# Patient Record
Sex: Female | Born: 1995 | Race: Black or African American | Hispanic: No | Marital: Single | Smoking: Never smoker
Health system: Southern US, Community
[De-identification: ages and names within clinical notes are randomized; demographics above are authoritative.]

## PROBLEM LIST (undated history)

## (undated) HISTORY — PX: BREAST SURGERY: SHX581

## (undated) HISTORY — PX: DG SCOLIOSIS SERIES (ARMC HX): HXRAD1605

## (undated) HISTORY — PX: BACK SURGERY: SHX140

---

## 2017-07-18 ENCOUNTER — Ambulatory Visit (HOSPITAL_COMMUNITY)
Admission: EM | Admit: 2017-07-18 | Discharge: 2017-07-18 | Disposition: A | Discharge status: Home / Self Care | Payer: No Typology Code available for payment source | Attending: Family Medicine | Admitting: Family Medicine

## 2017-07-18 ENCOUNTER — Encounter (HOSPITAL_COMMUNITY): Payer: Self-pay | Admitting: Family Medicine

## 2017-07-18 DIAGNOSIS — W2203XA Walked into furniture, initial encounter: Secondary | ICD-10-CM | POA: Diagnosis not present

## 2017-07-18 DIAGNOSIS — M79675 Pain in left toe(s): Secondary | ICD-10-CM

## 2017-07-18 DIAGNOSIS — S99922A Unspecified injury of left foot, initial encounter: Secondary | ICD-10-CM

## 2017-07-18 MED ORDER — NAPROXEN 500 MG PO TABS: 500.0000 mg | ORAL_TABLET | Freq: Two times a day (BID) | ORAL | 0 refills | Status: AC

## 2017-07-18 NOTE — ED Provider Notes (Signed)
[facility]    CSN: [id number] Arrival date & time: 07/18/17  1315     History   Chief Complaint Chief Complaint  Patient presents with  . Toe Injury    HPI Julia Gaines is a 21 y.o. female.   21 year old female comes in with left fifth toe pain after jamming toe on wood furniture last night. She used ice compress and a dose of NSAIDs with good relief. However pain increased after having to walk to class. Denies numbness, tingling, open wound.      History reviewed. No pertinent past medical history.  There are no active problems to display for this patient.   History reviewed. No pertinent surgical history.  OB History    No data available       Home Medications    Prior to Admission medications   Medication Sig Start Date End Date Taking? Authorizing Provider  naproxen (NAPROSYN) 500 MG tablet Take 1 tablet (500 mg total) by mouth 2 (two) times daily. 07/18/17 07/28/17  Belinda Fisher, PA-C    Family History History reviewed. No pertinent family history.  Social History Social History  Substance Use Topics  . Smoking status: Not on file  . Smokeless tobacco: Not on file  . Alcohol use Not on file     Allergies   Patient has no known allergies.   Review of Systems Review of Systems  Musculoskeletal: Positive for arthralgias and joint swelling. Negative for gait problem and myalgias.  Skin: Negative for wound.     Physical Exam Triage Vital Signs ED Triage Vitals [07/18/17 1334]  Enc Vitals Group     BP 118/67     Pulse Rate 85     Resp 18     Temp 98.2 F (36.8 C)     Temp src      SpO2 100 %     Weight      Height      Head Circumference      Peak Flow      Pain Score 5     Pain Loc      Pain Edu?      Excl. in GC?    No data found.   Updated Vital Signs BP 118/67   Pulse 85   Temp 98.2 F (36.8 C)   Resp 18   LMP 07/04/2017   SpO2 100%   Visual Acuity Right Eye Distance:   Left Eye Distance:   Bilateral  Distance:    Right Eye Near:   Left Eye Near:    Bilateral Near:     Physical Exam  Constitutional: She is oriented to person, place, and time. She appears well-developed and well-nourished. No distress.  HENT:  Head: Normocephalic and atraumatic.  Musculoskeletal:  Swelling of the left fifth toe. Tenderness on palpation of the left fifth toe. No tenderness on palpation of the fifth metatarsal. No obvious deformity seen. Full range of motion of the toes, though with pain. Pedal pulses 2+ and equal bilaterally. Sensation intact and equal bilaterally.  Neurological: She is alert and oriented to person, place, and time.  Skin: Skin is warm and dry.     UC Treatments / Results  Labs (all labs ordered are listed, but only abnormal results are displayed) Labs Reviewed - No data to display  EKG  EKG Interpretation None       Radiology No results found.  Procedures Procedures (including critical care time)  Medications Ordered in UC Medications -  No data to display   Initial Impression / Assessment and Plan / UC Course  I have reviewed the triage vital signs and the nursing notes.  Pertinent labs & imaging results that were available during my care of the patient were reviewed by me and considered in my medical decision making (see chart for details).    Discussed with patient given full ROM of toe, without deformity seen, xray results most likely will not change treatment, patient agrees to defer xray for now. Naproxen as directed. Ice compress and elevation. Patient having trouble fitting into her shoes due to the pain, postop boot given in office. Discussed with patient this can take up to 2-3 weeks to completely resolve, but she should be feeling better each week. Return precautions given.  Final Clinical Impressions(s) / UC Diagnoses   Final diagnoses:  Injury of toe on left foot, initial encounter    New Prescriptions New Prescriptions   NAPROXEN (NAPROSYN) 500 MG  TABLET    Take 1 tablet (500 mg total) by mouth 2 (two) times daily.     Belinda Fisher, PA-C 07/18/17 1401    Linward Headland V, PA-C 07/18/17 1401

## 2017-07-18 NOTE — ED Triage Notes (Signed)
Pt here for left 5th toe injury. sts that she stumped it last night and the pain has decreased today. Was able to walk to class.

## 2017-07-18 NOTE — Discharge Instructions (Signed)
Naproxen start naproxen as directed. Continue ice compress and elevation. This can take up to 2-3 weeks to completely resolve, but should be feeling better each week. If symptoms worsens, does not resolve, numbness, tingling, increased swelling, follow-up for reevaluation.

## 2017-08-01 ENCOUNTER — Encounter (HOSPITAL_COMMUNITY): Payer: Self-pay | Admitting: Emergency Medicine

## 2017-08-01 ENCOUNTER — Ambulatory Visit (HOSPITAL_COMMUNITY)
Admission: EM | Admit: 2017-08-01 | Discharge: 2017-08-01 | Disposition: A | Discharge status: Home / Self Care | Payer: No Typology Code available for payment source

## 2017-08-01 DIAGNOSIS — M79675 Pain in left toe(s): Secondary | ICD-10-CM

## 2017-08-01 DIAGNOSIS — T148XXA Other injury of unspecified body region, initial encounter: Secondary | ICD-10-CM

## 2017-08-01 MED ORDER — NAPROXEN 500 MG PO TABS: 500.0000 mg | ORAL_TABLET | Freq: Two times a day (BID) | ORAL | 0 refills | Status: DC

## 2017-08-01 NOTE — ED Triage Notes (Signed)
Pt here for persistent left foot pain that's not getting any better.  Seen her eon 8/17 for toe inj.... Was given a post op shoe  Sx today include calf pain  Pain increases w/activity   A&O x4... NAD... Ambulatory

## 2017-08-01 NOTE — Discharge Instructions (Signed)
Continue naproxen as directed. Continue ice compress. Wear cam walker for more support. Monitor for worsening of symptoms, numbness, tingling, follow-up for reevaluation. If experiencing shortness of breath, chest pain, swelling of the calf, spreading redness, increased warmth, go to the emergency department for further evaluation.

## 2017-08-01 NOTE — ED Provider Notes (Signed)
[facility]    CSN: [id number] Arrival date & time: 08/01/17  1210     History   Chief Complaint Chief Complaint  Patient presents with  . Foot Pain    HPI Julia Gaines is a 21 y.o. female.   21 year old female returns for continued to pain, with calf pain after being evaluated 2 weeks ago. She states toe is feeling better, continuing ice compress for the swelling. Patient is student, with lots of walking, and causes more toe and calf pain. Denies calf swelling, erythema, increased warmth. Denies chest pain, shortness of breath, palpitations. She is needing more support until toe fully heals. Denies numbness, tingling. She continues to take naproxen with good relief.      History reviewed. No pertinent past medical history.  There are no active problems to display for this patient.   History reviewed. No pertinent surgical history.  OB History    No data available       Home Medications    Prior to Admission medications   Medication Sig Start Date End Date Taking? Authorizing Provider  etonogestrel (NEXPLANON) 68 MG IMPL implant 1 each by Subdermal route once.   Yes [provider]  naproxen (NAPROSYN) 500 MG tablet Take 1 tablet (500 mg total) by mouth 2 (two) times daily. 08/01/17   Belinda FisherYu, Maurisio Ruddy V, PA-C    Family History History reviewed. No pertinent family history.  Social History Social History  Substance Use Topics  . Smoking status: Never Smoker  . Smokeless tobacco: Never Used  . Alcohol use No     Allergies   Patient has no known allergies.   Review of Systems Review of Systems  Reason unable to perform ROS: See HPI as above.     Physical Exam Triage Vital Signs ED Triage Vitals  Enc Vitals Group     BP 08/01/17 1250 121/78     Pulse Rate 08/01/17 1250 74     Resp 08/01/17 1250 18     Temp 08/01/17 1250 98.7 F (37.1 C)     Temp Source 08/01/17 1250 Oral     SpO2 08/01/17 1250 100 %     Weight --      Height --       Head Circumference --      Peak Flow --      Pain Score 08/01/17 1251 5     Pain Loc --      Pain Edu? --      Excl. in GC? --    No data found.   Updated Vital Signs BP 121/78 (BP Location: Right Arm)   Pulse 74   Temp 98.7 F (37.1 C) (Oral)   Resp 18   LMP 07/18/2017   SpO2 100%       Physical Exam  Constitutional: She is oriented to person, place, and time. She appears well-developed and well-nourished. No distress.  HENT:  Head: Normocephalic and atraumatic.  Eyes: Pupils are equal, round, and reactive to light. Conjunctivae are normal.  Cardiovascular: Normal rate, regular rhythm and normal heart sounds.  Exam reveals no gallop and no friction rub.   No murmur heard. Pulmonary/Chest: Effort normal and breath sounds normal. She has no wheezes. She has no rales.  Musculoskeletal:  No swelling, erythema, increased warmth. Mild tenderness on palpation of the left calf, left 5th toe. Full range of motion of knee and ankle. Strength normal and equal bilaterally. Sensation intact and equal bilaterally.  Pedal pulses 2+ and  equal bilaterally. Negative Homans.  Neurological: She is alert and oriented to person, place, and time.  Skin: Skin is warm and dry.     UC Treatments / Results  Labs (all labs ordered are listed, but only abnormal results are displayed) Labs Reviewed - No data to display  EKG  EKG Interpretation None       Radiology No results found.  Procedures Procedures (including critical care time)  Medications Ordered in UC Medications - No data to display   Initial Impression / Assessment and Plan / UC Course  I have reviewed the triage vital signs and the nursing notes.  Pertinent labs & imaging results that were available during my care of the patient were reviewed by me and considered in my medical decision making (see chart for details).    Calf pain most likely due to muscle strain from compensation of toe. Given patient is  student, with lots of walking, will start cam walker. Patient to continue naproxen, elevation, ice compress. Giving left calf without tightness, swelling, erythema, increased warmth, negative Homans, low suspicion for DVT. Return precautions given.  Final Clinical Impressions(s) / UC Diagnoses   Final diagnoses:  Toe pain, left  Muscle strain    New Prescriptions Discharge Medication List as of 08/01/2017  1:30 PM    START taking these medications   Details  naproxen (NAPROSYN) 500 MG tablet Take 1 tablet (500 mg total) by mouth 2 (two) times daily., Starting Fri 08/01/2017, Normal         Cathie Hoops, Anusha Claus V, PA-C 08/01/17 2015

## 2017-09-16 ENCOUNTER — Encounter (HOSPITAL_COMMUNITY): Payer: Self-pay | Admitting: Emergency Medicine

## 2017-09-16 ENCOUNTER — Ambulatory Visit (HOSPITAL_COMMUNITY)
Admission: EM | Admit: 2017-09-16 | Discharge: 2017-09-16 | Disposition: A | Discharge status: Home / Self Care | Payer: Self-pay | Attending: Family Medicine | Admitting: Family Medicine

## 2017-09-16 DIAGNOSIS — J020 Streptococcal pharyngitis: Secondary | ICD-10-CM

## 2017-09-16 MED ORDER — ONDANSETRON HCL 4 MG PO TABS: 4.0000 mg | ORAL_TABLET | Freq: Four times a day (QID) | ORAL | 0 refills | Status: DC

## 2017-09-16 MED ORDER — AZITHROMYCIN 250 MG PO TABS: ORAL_TABLET | ORAL | 0 refills | Status: DC

## 2017-09-16 NOTE — ED Triage Notes (Signed)
PT reports 2-3 days ago she developed abdominal pain, nausea, body aches, fever, sore throat, and chills. PT reports her temp was 101 this morning. PT took something OTC for fever this morning at 0800. PT reports she vomited 4-5 times last night. Some diarrhea as well.

## 2017-09-16 NOTE — Discharge Instructions (Signed)
Continue to push fluids, practice good hand hygiene, and cover your mouth if you cough.  If you start having worsening symptoms, shaking or shortness of breath, seek immediate care.  Throw out your toothbrush after 24 hrs of being on antibiotic.

## 2017-09-16 NOTE — ED Provider Notes (Signed)
[facility]    CSN: [id number] Arrival date & time: 09/16/17  1004     History   Chief Complaint Chief Complaint  Patient presents with  . Influenza    HPI Julia Gaines is a 21 y.o. female.   HPI Duration: 3 days  Associated symptoms: sore throat, myalgia, nausea, and cough Denies: sinus congestion, rhinorrhea, itchy watery eyes, ear pain, ear drainage and shortness of breath Treatment to date: Ibuprofen Sick contacts: No    History reviewed. No pertinent past medical history.  Past Surgical History:  Procedure Laterality Date  . BREAST SURGERY     fibroid removed  . DG SCOLIOSIS SERIES ([facility] HX)     Home Medications    Prior to Admission medications   Medication Sig Start Date End Date Taking? Authorizing Provider  etonogestrel (NEXPLANON) 68 MG IMPL implant 1 each by Subdermal route once.   Yes [provider]  montelukast (SINGULAIR) 10 MG tablet Take 10 mg by mouth at bedtime.   Yes [provider]  azithromycin (ZITHROMAX) 250 MG tablet Take 2 tabs the first day and then 1 tab daily until you run out. 09/16/17   Sharlene Dory, DO  ondansetron (ZOFRAN) 4 MG tablet Take 1 tablet (4 mg total) by mouth every 6 (six) hours. 09/16/17   Sharlene Dory, DO   Social History Social History  Substance Use Topics  . Smoking status: Never Smoker  . Smokeless tobacco: Never Used  . Alcohol use No     Allergies   Penicillins   Review of Systems Review of Systems  Constitutional: Positive for fever.  HENT: Positive for sore throat.      Physical Exam Triage Vital Signs ED Triage Vitals  Enc Vitals Group     BP 09/16/17 1027 124/81     Pulse Rate 09/16/17 1027 83     Resp 09/16/17 1027 16     Temp 09/16/17 1027 98.5 F (36.9 C)     Temp Source 09/16/17 1027 Oral     SpO2 09/16/17 1027 100 %     Weight 09/16/17 1031 130 lb (59 kg)     Height 09/16/17 1031  (1.651 m)   Updated Vital Signs BP  124/81 (BP Location: Right Arm)   Pulse 83   Temp 98.5 F (36.9 C) (Oral)   Resp 16   Ht  (1.651 m)   Wt 130 lb (59 kg)   LMP 09/12/2017 Comment: nexplanon  SpO2 100%   BMI 21.63 kg/m  Physical Exam  Constitutional: She appears well-developed and well-nourished.  HENT:  Head: Normocephalic and atraumatic.  Right Ear: External ear normal.  Left Ear: External ear normal.  Nose: Nose normal.  Mouth/Throat: Oropharyngeal exudate present.  Mildly erythematous oropharynx  Eyes: Pupils are equal, round, and reactive to light. EOM are normal.  Neck:  +Cerv adenopathy, +TTP  Cardiovascular: Normal rate and regular rhythm.   Pulmonary/Chest: Effort normal and breath sounds normal. No respiratory distress.  Psychiatric: She has a normal mood and affect. Judgment normal.     UC Treatments / Results  Procedures Procedures none  Initial Impression / Assessment and Plan / UC Course  I have reviewed the triage vital signs and the nursing notes.  Pertinent labs & imaging results that were available during my care of the patient were reviewed by me and considered in my medical decision making (see chart for details).    3/4 Cent[place] criteria, will empirically treat. Throw out  toothbrush after 24 hours of being on abx. Zpak given PCN allergy. Supportive care. F/u with PCP prn. Pt voiced understanding and agreement to the plan.  Final Clinical Impressions(s) / UC Diagnoses   Final diagnoses:  Streptococcal sore throat    New Prescriptions Discharge Medication List as of 09/16/2017 10:55 AM    START taking these medications   Details  azithromycin (ZITHROMAX) 250 MG tablet Take 2 tabs the first day and then 1 tab daily until you run out., Normal    ondansetron (ZOFRAN) 4 MG tablet Take 1 tablet (4 mg total) by mouth every 6 (six) hours., Starting Tue 09/16/2017, Normal         Controlled Substance Prescriptions [place] Controlled Substance Registry consulted? Not Applicable     Sharlene Dory, [place] 09/16/17 1624

## 2021-01-08 ENCOUNTER — Encounter (HOSPITAL_COMMUNITY): Payer: Self-pay

## 2021-01-08 ENCOUNTER — Other Ambulatory Visit: Payer: Self-pay

## 2021-01-08 ENCOUNTER — Emergency Department (HOSPITAL_COMMUNITY)
Admission: EM | Admit: 2021-01-08 | Discharge: 2021-01-08 | Disposition: A | Discharge status: Home / Self Care | Payer: BC Managed Care – PPO | Attending: Emergency Medicine | Admitting: Emergency Medicine

## 2021-01-08 ENCOUNTER — Emergency Department (HOSPITAL_COMMUNITY): Payer: BC Managed Care – PPO

## 2021-01-08 DIAGNOSIS — R42 Dizziness and giddiness: Secondary | ICD-10-CM | POA: Diagnosis not present

## 2021-01-08 DIAGNOSIS — R519 Headache, unspecified: Secondary | ICD-10-CM | POA: Diagnosis not present

## 2021-01-08 DIAGNOSIS — R55 Syncope and collapse: Secondary | ICD-10-CM | POA: Diagnosis present

## 2021-01-08 LAB — CBC
HCT: 42.5 % (ref 36.0–46.0)
Hemoglobin: 13.8 g/dL (ref 12.0–15.0)
MCH: 28.5 pg (ref 26.0–34.0)
MCHC: 32.5 g/dL (ref 30.0–36.0)
MCV: 87.6 fL (ref 80.0–100.0)
Platelets: 281 10*3/uL (ref 150–400)
RBC: 4.85 MIL/uL (ref 3.87–5.11)
RDW: 14.3 % (ref 11.5–15.5)
WBC: 6.9 10*3/uL (ref 4.0–10.5)
nRBC: 0 % (ref 0.0–0.2)

## 2021-01-08 LAB — URINALYSIS, ROUTINE W REFLEX MICROSCOPIC
Bilirubin Urine: NEGATIVE
Glucose, UA: NEGATIVE mg/dL
Hgb urine dipstick: NEGATIVE
Ketones, ur: NEGATIVE mg/dL
Leukocytes,Ua: NEGATIVE
Nitrite: NEGATIVE
Protein, ur: NEGATIVE mg/dL
Specific Gravity, Urine: 1.013 (ref 1.005–1.030)
pH: 6 (ref 5.0–8.0)

## 2021-01-08 LAB — BASIC METABOLIC PANEL
Anion gap: 12 (ref 5–15)
BUN: 11 mg/dL (ref 6–20)
CO2: 24 mmol/L (ref 22–32)
Calcium: 9.2 mg/dL (ref 8.9–10.3)
Chloride: 102 mmol/L (ref 98–111)
Creatinine, Ser: 0.74 mg/dL (ref 0.44–1.00)
GFR, Estimated: >60 mL/min (ref >60)
Glucose, Bld: 81 mg/dL (ref 70–99)
Potassium: 3.6 mmol/L (ref 3.5–5.1)
Sodium: 138 mmol/L (ref 135–145)

## 2021-01-08 LAB — I-STAT BETA HCG BLOOD, ED (MC, WL, AP ONLY): I-stat hCG, quantitative: <5 m[IU]/mL (ref <5)

## 2021-01-08 MED ORDER — SODIUM CHLORIDE 0.9 % IV BOLUS
1000.0000 mL | Freq: Once | INTRAVENOUS | Status: AC
  Administered 2021-01-08: 1000 mL via INTRAVENOUS

## 2021-01-08 MED ORDER — ACETAMINOPHEN 325 MG PO TABS
650.0000 mg | ORAL_TABLET | Freq: Once | ORAL | Status: AC
  Administered 2021-01-08: 650 mg via ORAL
  Filled 2021-01-08: qty 2

## 2021-01-08 NOTE — ED Triage Notes (Signed)
Patient reports that she passed out at her house and when she woke up she was lying on the floor. Incident occurred approx 1 hour ago. Patient states the left side of her head is sore. Patient denies any N/V or blurred vision.

## 2021-01-08 NOTE — ED Provider Notes (Signed)
[facility] Provider Note   CSN: [id number] Arrival date & time: 01/08/21  1341     History Chief Complaint  Patient presents with  . Loss of Consciousness  . Head Injury    Julia Gaines is a 25 y.o. female.  The history is provided by the patient.  Loss of Consciousness Episode history:  Single Most recent episode:  Today Progression:  Resolved Chronicity:  New Context: standing up   Relieved by:  Nothing Worsened by:  Nothing Associated symptoms: dizziness and headaches   Associated symptoms: no anxiety, no chest pain, no confusion, no difficulty breathing, no fever, no focal weakness, no malaise/fatigue, no nausea, no palpitations, no recent fall, no recent injury, no recent surgery, no rectal bleeding, no seizures, no shortness of breath, no visual change, no vomiting and no weakness   Risk factors: no coronary artery disease and no seizures        History reviewed. No pertinent past medical history.  There are no problems to display for this patient.   Past Surgical History:  Procedure Laterality Date  . BACK SURGERY    . BREAST SURGERY     fibroid removed  . DG SCOLIOSIS SERIES ([facility] HX)       OB History   No obstetric history on file.     Family History  Problem Relation Age of Onset  . Hypertension Father     Social History   Tobacco Use  . Smoking status: Never Smoker  . Smokeless tobacco: Never Used  Vaping Use  . Vaping Use: Never used  Substance Use Topics  . Alcohol use: Yes    Comment: occasionally  . Drug use: No    Home Medications Prior to Admission medications   Medication Sig Start Date End Date Taking? Authorizing Provider  cholecalciferol (VITAMIN D3) 25 MCG (1000 UNIT) tablet Take 1,000 Units by mouth daily.   Yes [provider]  fluticasone (FLONASE) 50 MCG/ACT nasal spray Place 1 spray into both nostrils daily as needed for allergies or rhinitis.   Yes [provider]  montelukast (SINGULAIR) 10 MG tablet Take 10 mg by mouth at bedtime.   Yes [provider]  Multiple Vitamin (MULTIVITAMIN WITH MINERALS) TABS tablet Take 1 tablet by mouth daily.   Yes [provider]  norethindrone-ethinyl estradiol (LOESTRIN FE) 1-20 MG-MCG tablet Take 1 tablet by mouth daily. 08/07/19  Yes [provider]  venlafaxine XR (EFFEXOR-XR) 150 MG 24 hr capsule Take 150 mg by mouth daily with breakfast.   Yes [provider]  azithromycin (ZITHROMAX) 250 MG tablet Take 2 tabs the first day and then 1 tab daily until you run out. Patient not taking: No sig reported 09/16/17   Sharlene Dory, DO  ondansetron (ZOFRAN) 4 MG tablet Take 1 tablet (4 mg total) by mouth every 6 (six) hours. Patient not taking: No sig reported 09/16/17   Sharlene Dory, DO    Allergies    Penicillins  Review of Systems   Review of Systems  Constitutional: Negative for chills, fever and malaise/fatigue.  HENT: Negative for ear pain and sore throat.   Eyes: Negative for pain and visual disturbance.  Respiratory: Negative for cough and shortness of breath.   Cardiovascular: Positive for syncope. Negative for chest pain and palpitations.  Gastrointestinal: Negative for abdominal pain, nausea and vomiting.  Genitourinary: Negative for dysuria and hematuria.  Musculoskeletal: Negative for arthralgias and back pain.  Skin: Negative for color  change and rash.  Neurological: Positive for dizziness and headaches. Negative for tremors, focal weakness, seizures, syncope, facial asymmetry, speech difficulty, weakness, light-headedness and numbness.  Psychiatric/Behavioral: Negative for confusion.  All other systems reviewed and are negative.   Physical Exam Updated Vital Signs  ED Triage Vitals  Enc Vitals Group     BP 01/08/21 1412 131/90     Pulse Rate 01/08/21 1412 93     Resp 01/08/21 1412 14     Temp 01/08/21 1412 98.8 F (37.1 C)     Temp  Source 01/08/21 1412 Oral     SpO2 01/08/21 1412 100 %     Weight 01/08/21 1410 135 lb (61.2 kg)     Height 01/08/21 1410 5\' 5"  (1.651 m)     Head Circumference --      Peak Flow --      Pain Score 01/08/21 1413 7     Pain Loc --      Pain Edu? --      Excl. in GC? --     Physical Exam Vitals and nursing note reviewed.  Constitutional:      General: She is not in acute distress.    Appearance: She is well-developed and well-nourished. She is not ill-appearing.  HENT:     Head: Normocephalic and atraumatic.     Nose: Nose normal.     Mouth/Throat:     Mouth: Mucous membranes are dry.  Eyes:     Extraocular Movements: Extraocular movements intact.     Conjunctiva/sclera: Conjunctivae normal.     Pupils: Pupils are equal, round, and reactive to light.  Cardiovascular:     Rate and Rhythm: Normal rate and regular rhythm.     Pulses: Normal pulses.     Heart sounds: Normal heart sounds. No murmur heard.   Pulmonary:     Effort: Pulmonary effort is normal. No respiratory distress.     Breath sounds: Normal breath sounds.  Abdominal:     Palpations: Abdomen is soft.     Tenderness: There is no abdominal tenderness.  Musculoskeletal:        General: No tenderness or edema. Normal range of motion.     Cervical back: Normal range of motion and neck supple.  Skin:    General: Skin is warm and dry.  Neurological:     General: No focal deficit present.     Mental Status: She is alert and oriented to person, place, and time.     Cranial Nerves: No cranial nerve deficit.     Sensory: No sensory deficit.     Motor: No weakness.     Coordination: Coordination normal.     Comments: 5+ out of 5 strength throughout, normal sensation, no drift, normal finger-to-nose finger, normal speech  Psychiatric:        Mood and Affect: Mood and affect normal.     ED Results / Procedures / Treatments   Labs (all labs ordered are listed, but only abnormal results are displayed) Labs Reviewed   BASIC METABOLIC PANEL  CBC  URINALYSIS, ROUTINE W REFLEX MICROSCOPIC  CBG MONITORING, ED  I-STAT BETA HCG BLOOD, ED ([facility], [facility], [facility] ONLY)  I-STAT BETA HCG BLOOD, ED ([facility], [facility], [facility] ONLY)    EKG  EKG shows sinus rhythm.  No ischemic changes.  No interval abnormalities. Radiology CT Head Wo Contrast  Result Date: 01/08/2021 CLINICAL DATA:  Passed out waking up on the floor approximate 1 hour ago with left-sided head pain.  Meds nausea vomiting or blurred vision. EXAM: CT HEAD WITHOUT CONTRAST TECHNIQUE: Contiguous axial images were obtained from the base of the skull through the vertex without intravenous contrast. COMPARISON:  None. FINDINGS: Brain: No evidence of acute infarction, hemorrhage, hydrocephalus, extra-axial collection or mass lesion/mass effect. Vascular: No hyperdense vessel or unexpected calcification. Skull: Normal. Negative for fracture or focal lesion. Sinuses/Orbits: No acute finding. Other: None. IMPRESSION: No acute intracranial pathology. Electronically Signed   By: Maudry Mayhew MD   On: 01/08/2021 22:08    Procedures Procedures   Medications Ordered in ED Medications  sodium chloride 0.9 % bolus 1,000 mL (1,000 mLs Intravenous New Bag/Given 01/08/21 2147)  acetaminophen (TYLENOL) tablet 650 mg (650 mg Oral Given 01/08/21 2148)    ED Course  I have reviewed the triage vital signs and the nursing notes.  Pertinent labs & imaging results that were available during my care of the patient were reviewed by me and considered in my medical decision making (see chart for details).    MDM Rules/Calculators/A&P                          Julia Gaines is a 25 year old female with no significant medical history presents the ED after syncopal episode.  Patient with normal vitals.  No fever.  Patient states that she stood up quickly felt dizzy and next thing she noticed that she was on the floor.  She does not know if she lost consciousness.  States that she has a left-sided  headache and thinks that she might hit her head.  She does not have any bruising or swelling to the head.  Overall she is asymptomatic now and feels better.  Neurologically she is intact.  She had not had much to eat or drink prior to this event today.  Denies any alcohol or drug use.  Lab work shows no significant anemia, electrolyte abnormality, kidney injury.  Pregnancy test negative.  EKG shows sinus rhythm.  No ischemic changes.  CT scan of the head showed no acute injury.  Does not appear that she had any seizure-like activity or postictal state or incontinence or tongue biting.  Overall suspect a vasovagal type event.  Recommend rest and hydration and discharged in ED in good condition.  This chart was dictated using voice recognition software.  Despite best efforts to proofread,  errors can occur which can change the documentation meaning.    Final Clinical Impression(s) / ED Diagnoses Final diagnoses:  Syncope and collapse    Rx / DC Orders ED Discharge Orders    None       Virgina Norfolk, DO 01/08/21 2242

## 2022-06-19 IMAGING — CT CT HEAD W/O CM
3 series · 15 of 47 positions shown, 18 images · non-contrast
Comparison: None.

CLINICAL DATA: Passed out waking up on the floor approximate 1 hour
ago with left-sided head pain. Meds nausea vomiting or blurred
vision.

EXAM:
CT HEAD WITHOUT CONTRAST
TECHNIQUE: Contiguous axial images were obtained from the base of the skull
through the vertex without intravenous contrast.

[Series 2: head wo · axial · 0.43mm/px · z∈[-122,+3]mm · 9 of 30 slices shown, 12 images]
[im 3/30  brain]
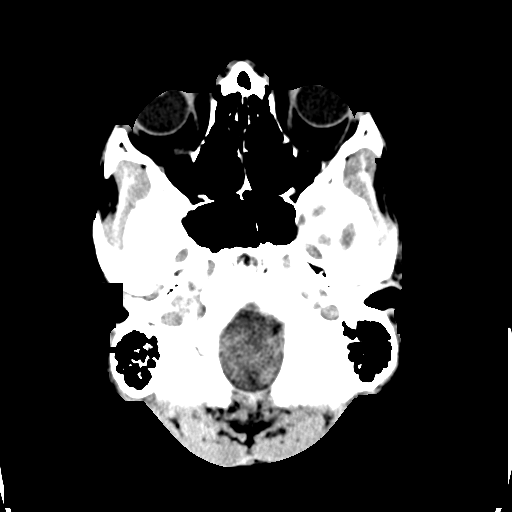
[im 3/30  bone]
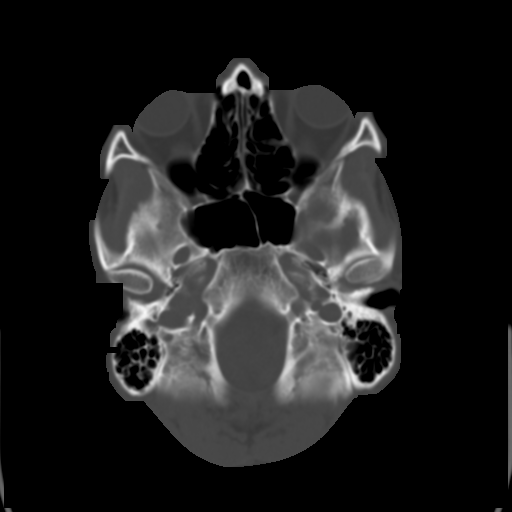
[im 6/30  brain]
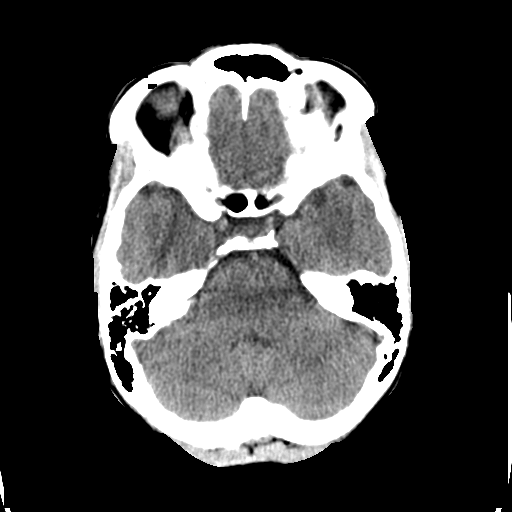
[im 9/30  brain]
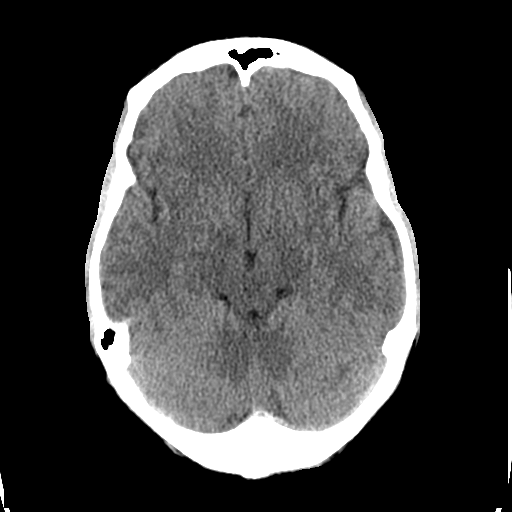
[im 12/30  brain]
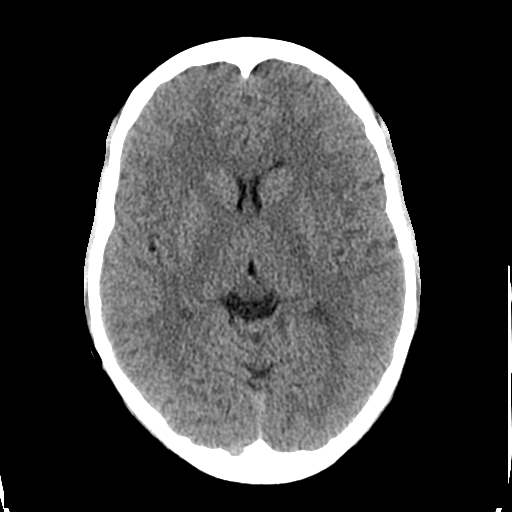
[im 16/30  brain]
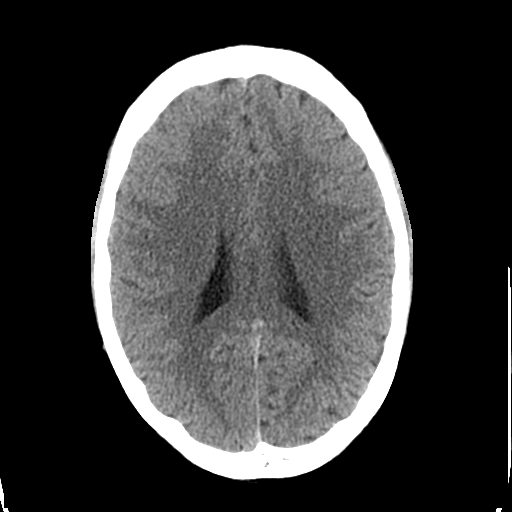
[im 16/30  bone]
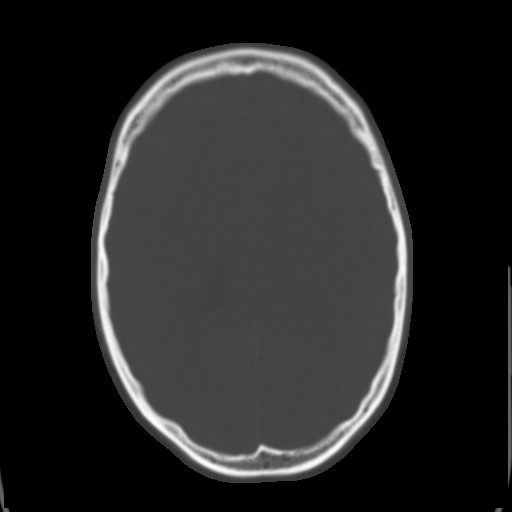
[im 19/30  brain]
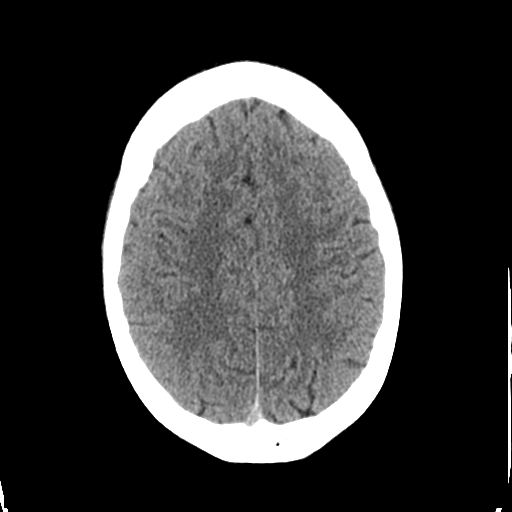
[im 22/30  brain]
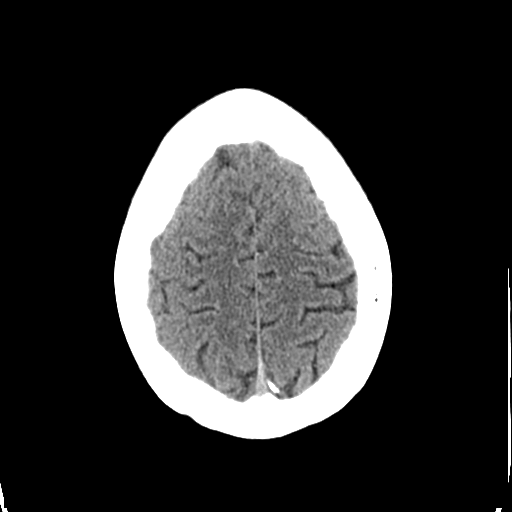
[im 25/30  brain]
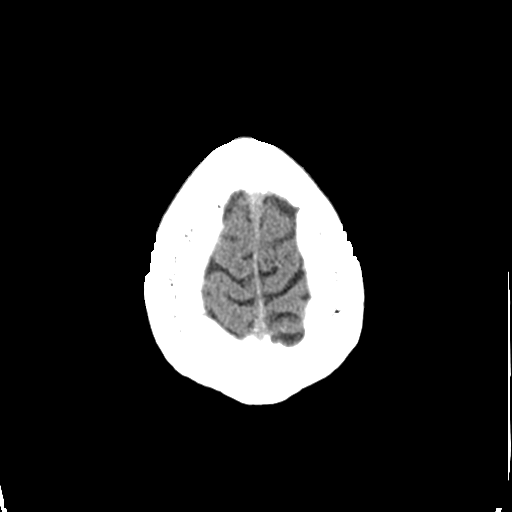
[im 28/30  brain]
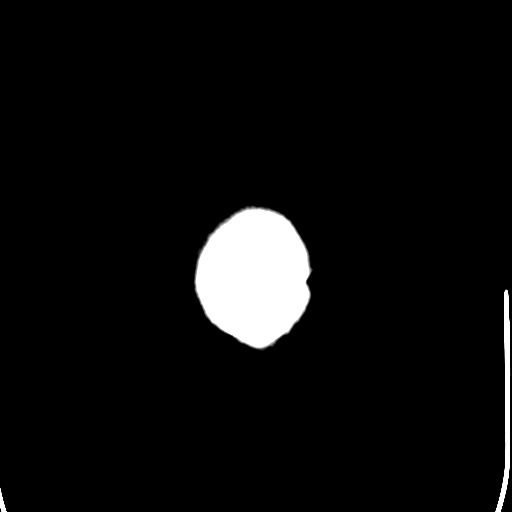
[im 28/30  bone]
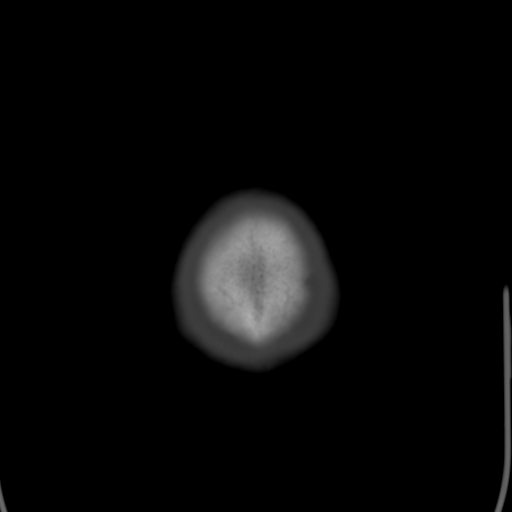

[Series 5: coronal soft tissue · coronal · 0.29mm/px · 3 of 70 slices shown]
[im 24/70  brain]
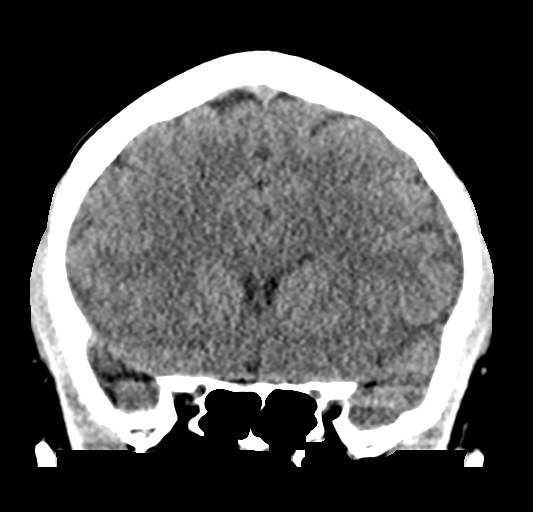
[im 31/70  brain]
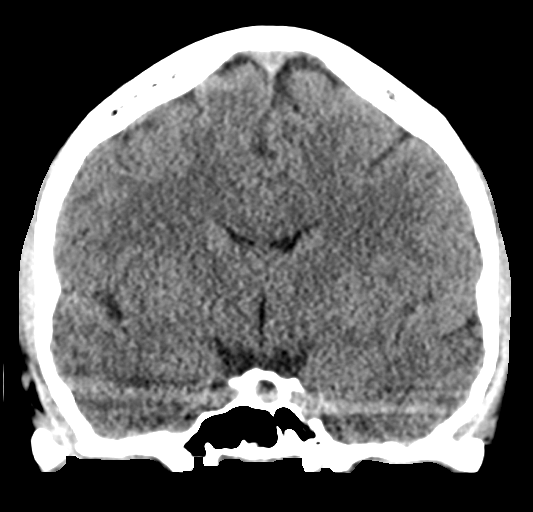
[im 39/70  brain]
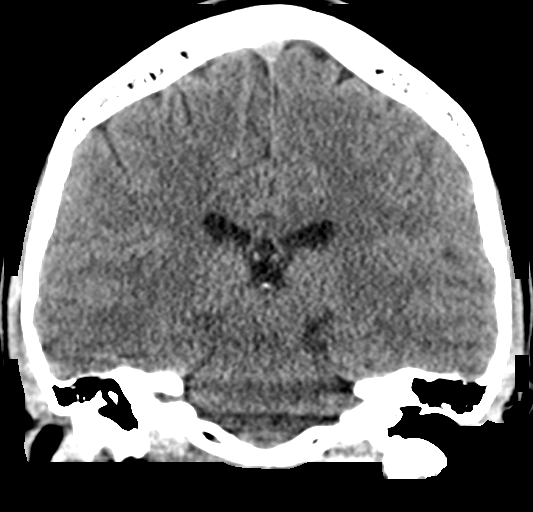

[Series 6: sagittal soft tissue · sagittal · 0.29mm/px · 3 of 51 slices shown]
[im 17/51  brain]
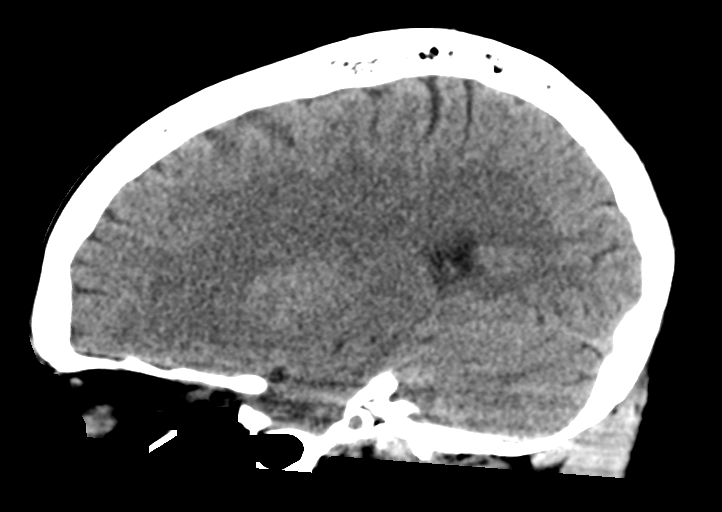
[im 26/51  brain]
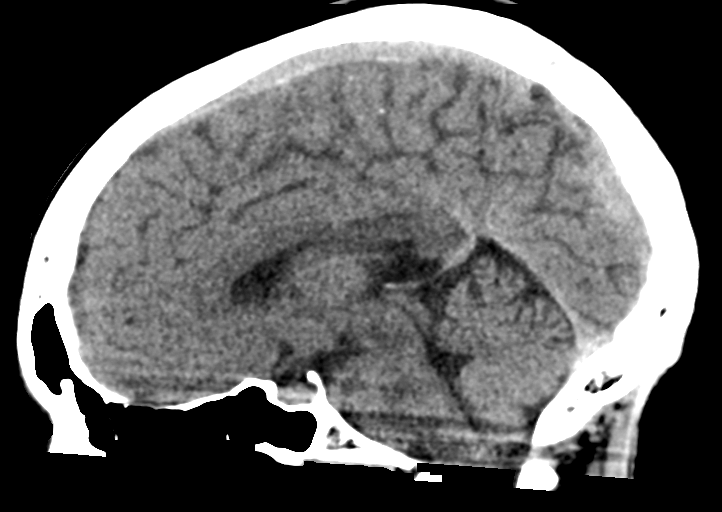
[im 34/51  brain]
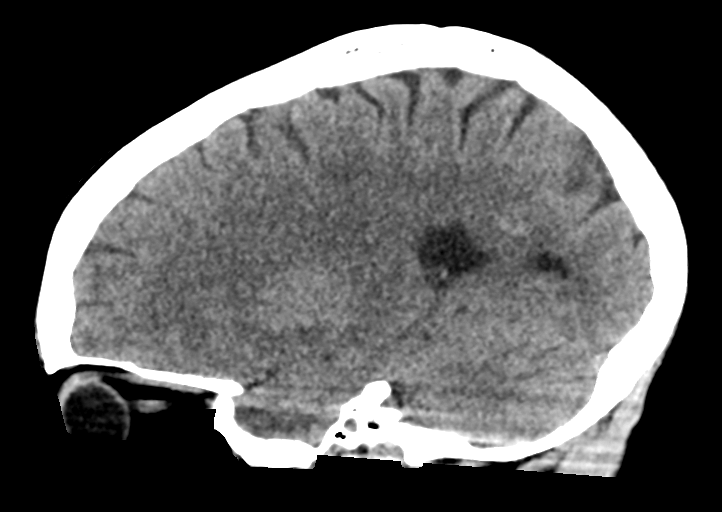

[15 of 47 positions shown; findings below may reference images not displayed]

FINDINGS: Brain: No evidence of acute infarction, hemorrhage, hydrocephalus,
extra-axial collection or mass lesion/mass effect.

Vascular: No hyperdense vessel or unexpected calcification.

Skull: Normal. Negative for fracture or focal lesion.

Sinuses/Orbits: No acute finding.

Other: None.
IMPRESSION: No acute intracranial pathology.

## 2023-11-09 ENCOUNTER — Ambulatory Visit
Admission: EM | Admit: 2023-11-09 | Discharge: 2023-11-09 | Disposition: A | Discharge status: Home / Self Care | Payer: BC Managed Care – PPO | Attending: Family Medicine | Admitting: Family Medicine

## 2023-11-09 DIAGNOSIS — R070 Pain in throat: Secondary | ICD-10-CM

## 2023-11-09 DIAGNOSIS — B349 Viral infection, unspecified: Secondary | ICD-10-CM

## 2023-11-09 DIAGNOSIS — J4520 Mild intermittent asthma, uncomplicated: Secondary | ICD-10-CM | POA: Diagnosis present

## 2023-11-09 DIAGNOSIS — J309 Allergic rhinitis, unspecified: Secondary | ICD-10-CM

## 2023-11-09 LAB — POC COVID19/FLU A&B COMBO
Covid Antigen, POC: NEGATIVE
Influenza A Antigen, POC: NEGATIVE
Influenza B Antigen, POC: NEGATIVE

## 2023-11-09 LAB — POCT RAPID STREP A (OFFICE): Rapid Strep A Screen: NEGATIVE

## 2023-11-09 MED ORDER — PROMETHAZINE-DM 6.25-15 MG/5ML PO SYRP: 5.0000 mL | ORAL_SOLUTION | Freq: Three times a day (TID) | ORAL | 0 refills | Status: DC | PRN

## 2023-11-09 MED ORDER — PSEUDOEPHEDRINE HCL 30 MG PO TABS: 30.0000 mg | ORAL_TABLET | Freq: Three times a day (TID) | ORAL | 0 refills | Status: DC | PRN

## 2023-11-09 MED ORDER — DEXAMETHASONE SODIUM PHOSPHATE 10 MG/ML IJ SOLN
10.0000 mg | Freq: Once | INTRAMUSCULAR | Status: AC
  Administered 2023-11-09: 10 mg via INTRAMUSCULAR

## 2023-11-09 MED ORDER — ALBUTEROL SULFATE HFA 108 (90 BASE) MCG/ACT IN AERS: 1.0000 | INHALATION_SPRAY | Freq: Four times a day (QID) | RESPIRATORY_TRACT | 0 refills | Status: AC | PRN

## 2023-11-09 MED ORDER — CETIRIZINE HCL 10 MG PO TABS: 10.0000 mg | ORAL_TABLET | Freq: Every day | ORAL | 0 refills | Status: AC

## 2023-11-09 NOTE — Discharge Instructions (Addendum)
We will manage this as a viral syndrome. For sore throat or cough try using a honey-based tea. Use 3 teaspoons of honey with juice squeezed from half lemon. Place shaved pieces of ginger into 1/2-1 cup of water and warm over stove top. Then mix the ingredients and repeat every 4 hours as needed. Please take Tylenol 500mg -650mg  once every 6 hours for fevers, aches and pains. Hydrate very well with at least 2 liters (64 ounces) of water. Eat light meals such as soups (chicken and noodles, chicken wild rice, vegetable).  Do not eat any foods that you are allergic to.  Start an antihistamine like Zyrtec (10mg  daily) for postnasal drainage, sinus congestion.  You can take this together with pseudoephedrine (Sudafed) at a dose of 30 mg 3 times a day or twice daily as needed for the same kind of congestion.  Use the albuterol inhaler for the chest symptoms. Use cough medication as needed.

## 2023-11-09 NOTE — ED Triage Notes (Signed)
Pt reports chills, body aches x 1 day; cough, sore throat, shortness of breath when lay down and chest congestion x 2 days. Reports she had fever 102.0 F  2 days ago.

## 2023-11-09 NOTE — ED Provider Notes (Signed)
[facility]  Note:  This document was prepared using Conservation officer, historic buildings and may include unintentional dictation errors.  MRN: [id number] DOB: 02/08/1996  Subjective:   Julia Gaines is a 27 y.o. female presenting for 2-day history of acute onset persistent chest tightness, body pains, malaise, fatigue, throat pain, painful swallowing, hoarseness, chills.  Highest temperature for fever was 102 F.  No current facility-administered medications for this encounter.  Current Outpatient Medications:    azithromycin (ZITHROMAX) 250 MG tablet, Take 2 tabs the first day and then 1 tab daily until you run out. (Patient not taking: Reported on 01/08/2021), Disp: 6 tablet, Rfl: 0   cholecalciferol (VITAMIN D3) 25 MCG (1000 UNIT) tablet, Take 1,000 Units by mouth daily., Disp: , Rfl:    fluticasone (FLONASE) 50 MCG/ACT nasal spray, Place 1 spray into both nostrils daily as needed for allergies or rhinitis., Disp: , Rfl:    montelukast (SINGULAIR) 10 MG tablet, Take 10 mg by mouth at bedtime., Disp: , Rfl:    Multiple Vitamin (MULTIVITAMIN WITH MINERALS) TABS tablet, Take 1 tablet by mouth daily., Disp: , Rfl:    norethindrone-ethinyl estradiol (LOESTRIN FE) 1-20 MG-MCG tablet, Take 1 tablet by mouth daily., Disp: , Rfl:    ondansetron (ZOFRAN) 4 MG tablet, Take 1 tablet (4 mg total) by mouth every 6 (six) hours. (Patient not taking: Reported on 01/08/2021), Disp: 12 tablet, Rfl: 0   venlafaxine XR (EFFEXOR-XR) 150 MG 24 hr capsule, Take 150 mg by mouth daily with breakfast., Disp: , Rfl:    Allergies  Allergen Reactions   Penicillins Rash    History reviewed. No pertinent past medical history.   Past Surgical History:  Procedure Laterality Date   BACK SURGERY     BREAST SURGERY     fibroid removed   DG SCOLIOSIS SERIES ([facility] HX)      Family History  Problem Relation Age of Onset   Hypertension Father     Social History   Tobacco Use   Smoking  status: Never   Smokeless tobacco: Never  Vaping Use   Vaping status: Never Used  Substance Use Topics   Alcohol use: Yes    Comment: occasionally   Drug use: No    ROS   Objective:   Vitals: BP 124/85 (BP Location: Left Arm)   Pulse 90   Temp 98.8 F (37.1 C) (Oral)   Resp 16   Physical Exam Constitutional:      General: She is not in acute distress.    Appearance: Normal appearance. She is well-developed and normal weight. She is not ill-appearing, toxic-appearing or diaphoretic.  HENT:     Head: Normocephalic and atraumatic.     Right Ear: Tympanic membrane, ear canal and external ear normal. No drainage or tenderness. No middle ear effusion. There is no impacted cerumen. Tympanic membrane is not erythematous or bulging.     Left Ear: Tympanic membrane, ear canal and external ear normal. No drainage or tenderness.  No middle ear effusion. There is no impacted cerumen. Tympanic membrane is not erythematous or bulging.     Nose: Nose normal. No congestion or rhinorrhea.     Mouth/Throat:     Mouth: Mucous membranes are moist. No oral lesions.     Pharynx: No pharyngeal swelling, oropharyngeal exudate, posterior oropharyngeal erythema or uvula swelling.     Tonsils: No tonsillar exudate or tonsillar abscesses.  Eyes:     General: No scleral icterus.  Right eye: No discharge.        Left eye: No discharge.     Extraocular Movements: Extraocular movements intact.     Right eye: Normal extraocular motion.     Left eye: Normal extraocular motion.     Conjunctiva/sclera: Conjunctivae normal.  Cardiovascular:     Rate and Rhythm: Normal rate and regular rhythm.     Heart sounds: Normal heart sounds. No murmur heard.    No friction rub. No gallop.  Pulmonary:     Effort: Pulmonary effort is normal. No respiratory distress.     Breath sounds: No stridor. No wheezing, rhonchi or rales.  Chest:     Chest wall: No tenderness.  Musculoskeletal:     Cervical back: Normal  range of motion and neck supple.  Lymphadenopathy:     Cervical: No cervical adenopathy.  Skin:    General: Skin is warm and dry.  Neurological:     General: No focal deficit present.     Mental Status: She is alert and oriented to person, place, and time.  Psychiatric:        Mood and Affect: Mood normal.        Behavior: Behavior normal.    Results for orders placed or performed during the hospital encounter of 11/09/23 (from the past 24 hour(s))  POC Covid + Flu A/B Antigen     Status: None   Collection Time: 11/09/23 10:58 AM  Result Value Ref Range   Influenza A Antigen, POC Negative Negative   Influenza B Antigen, POC Negative Negative   Covid Antigen, POC Negative Negative  POCT rapid strep A     Status: None   Collection Time: 11/09/23 11:13 AM  Result Value Ref Range   Rapid Strep A Screen Negative Negative    Assessment and Plan :   PDMP not reviewed this encounter.  1. Acute viral syndrome   2. Mild intermittent asthma without complication   3. Throat pain   4. Allergic rhinitis, unspecified seasonality, unspecified trigger    Deferred imaging given clear cardiopulmonary exam, hemodynamically stable vital signs.  Strep culture pending.  Suspect viral URI, viral syndrome. Physical exam findings reassuring and vital signs stable for discharge. Advised supportive care, offered symptomatic relief. Counseled patient on potential for adverse effects with medications prescribed/recommended today, ER and return-to-clinic precautions discussed, patient verbalized understanding.     Wallis Bamberg, [place] 11/09/23 1238

## 2023-11-11 ENCOUNTER — Ambulatory Visit (INDEPENDENT_AMBULATORY_CARE_PROVIDER_SITE_OTHER): Payer: BC Managed Care – PPO

## 2023-11-11 ENCOUNTER — Ambulatory Visit
Admission: EM | Admit: 2023-11-11 | Discharge: 2023-11-11 | Disposition: A | Discharge status: Home / Self Care | Payer: BC Managed Care – PPO | Attending: Internal Medicine | Admitting: Internal Medicine

## 2023-11-11 DIAGNOSIS — B349 Viral infection, unspecified: Secondary | ICD-10-CM | POA: Diagnosis not present

## 2023-11-11 DIAGNOSIS — J4521 Mild intermittent asthma with (acute) exacerbation: Secondary | ICD-10-CM | POA: Diagnosis not present

## 2023-11-11 DIAGNOSIS — R0602 Shortness of breath: Secondary | ICD-10-CM

## 2023-11-11 DIAGNOSIS — R051 Acute cough: Secondary | ICD-10-CM

## 2023-11-11 MED ORDER — BENZONATATE 200 MG PO CAPS: 200.0000 mg | ORAL_CAPSULE | Freq: Three times a day (TID) | ORAL | 0 refills | Status: DC | PRN

## 2023-11-11 MED ORDER — PREDNISONE 20 MG PO TABS: 40.0000 mg | ORAL_TABLET | Freq: Every day | ORAL | 0 refills | Status: AC

## 2023-11-11 MED ORDER — IPRATROPIUM-ALBUTEROL 0.5-2.5 (3) MG/3ML IN SOLN
3.0000 mL | Freq: Once | RESPIRATORY_TRACT | Status: AC
  Administered 2023-11-11: 3 mL via RESPIRATORY_TRACT

## 2023-11-11 NOTE — Discharge Instructions (Signed)
Continue inhaler as needed.  Start prednisone daily for 5 days.  You may try Tessalon 3 times a day as needed for cough.  Lots of rest and fluids.  Please follow-up with your PCP if your symptoms do not improve.  Please go to the ER for any worsening symptoms.  I hope you feel better soon!

## 2023-11-11 NOTE — ED Triage Notes (Signed)
Pt presents with c/o trouble breathing, chest tightness and states she has trobule catching her breath. Pt states she feels worse than when she was seen recently.

## 2023-11-11 NOTE — ED Provider Notes (Signed)
[facility]    CSN: [id number] Arrival date & time: 11/11/23  1444      History   Chief Complaint Chief Complaint  Patient presents with   Cough    HPI Julia Gaines is a 27 y.o. female  presents for evaluation of URI symptoms for 4 days. Patient reports associated symptoms of cough, congestion, chest tightness, shortness of breath, difficulty taking a deep breath, and bodyaches. Denies N/V/D, fevers, ear pain, sore throat. Patient does  have a hx of asthma as a child but states she outgrew it and has not needed to use an inhaler in years. Patient does not have a history of smoking.  Patient was seen in urgent care on 12/8 for same symptoms.  She had a negative rapid flu COVID and strep test as well as strep throat culture.  She was given dexamethasone injection in clinic and prescribed Sudafed, cetirizine, albuterol, and promethazine.  She states she has been taking these with minimal improvement.  Pt has taken nothing OTC for symptoms. Pt has no other concerns at this time.    Cough Associated symptoms: myalgias and shortness of breath     No past medical history on file.  There are no problems to display for this patient.   Past Surgical History:  Procedure Laterality Date   BACK SURGERY     BREAST SURGERY     fibroid removed   DG SCOLIOSIS SERIES ([facility] HX)      OB History   No obstetric history on file.      Home Medications    Prior to Admission medications   Medication Sig Start Date End Date Taking? Authorizing Provider  benzonatate (TESSALON) 200 MG capsule Take 1 capsule (200 mg total) by mouth 3 (three) times daily as needed. 11/11/23  Yes Radford Pax, NP  predniSONE (DELTASONE) 20 MG tablet Take 2 tablets (40 mg total) by mouth daily with breakfast for 5 days. 11/11/23 11/16/23 Yes Radford Pax, NP  albuterol (VENTOLIN HFA) 108 (90 Base) MCG/ACT inhaler Inhale 1-2 puffs into the lungs every 6 (six) hours as needed for wheezing or shortness  of breath. 11/09/23   Wallis Bamberg, PA-C  azithromycin (ZITHROMAX) 250 MG tablet Take 2 tabs the first day and then 1 tab daily until you run out. Patient not taking: Reported on 01/08/2021 09/16/17   Sharlene Dory, DO  cetirizine (ZYRTEC ALLERGY) 10 MG tablet Take 1 tablet (10 mg total) by mouth daily. 11/09/23   Wallis Bamberg, PA-C  cholecalciferol (VITAMIN D3) 25 MCG (1000 UNIT) tablet Take 1,000 Units by mouth daily.    [provider]  fluticasone (FLONASE) 50 MCG/ACT nasal spray Place 1 spray into both nostrils daily as needed for allergies or rhinitis.    [provider]  montelukast (SINGULAIR) 10 MG tablet Take 10 mg by mouth at bedtime.    [provider]  Multiple Vitamin (MULTIVITAMIN WITH MINERALS) TABS tablet Take 1 tablet by mouth daily.    [provider]  norethindrone-ethinyl estradiol (LOESTRIN FE) 1-20 MG-MCG tablet Take 1 tablet by mouth daily. 08/07/19   [provider]  ondansetron (ZOFRAN) 4 MG tablet Take 1 tablet (4 mg total) by mouth every 6 (six) hours. Patient not taking: Reported on 01/08/2021 09/16/17   Sharlene Dory, DO  promethazine-dextromethorphan (PROMETHAZINE-DM) 6.25-15 MG/5ML syrup Take 5 mLs by mouth 3 (three) times daily as needed for cough. 11/09/23   Wallis Bamberg, PA-C  pseudoephedrine (SUDAFED) 30 MG tablet  Take 1 tablet (30 mg total) by mouth every 8 (eight) hours as needed for congestion. 11/09/23   Wallis Bamberg, PA-C  venlafaxine XR (EFFEXOR-XR) 150 MG 24 hr capsule Take 150 mg by mouth daily with breakfast.    [provider]    Family History Family History  Problem Relation Age of Onset   Hypertension Father     Social History Social History   Tobacco Use   Smoking status: Never   Smokeless tobacco: Never  Vaping Use   Vaping status: Never Used  Substance Use Topics   Alcohol use: Yes    Comment: occasionally   Drug use: No     Allergies   Penicillins   Review of  Systems Review of Systems  HENT:  Positive for congestion.   Respiratory:  Positive for cough and shortness of breath.   Musculoskeletal:  Positive for myalgias.     Physical Exam Triage Vital Signs ED Triage Vitals [11/11/23 1500]  Encounter Vitals Group     BP 115/78     Systolic BP Percentile      Diastolic BP Percentile      Pulse Rate (!) 105     Resp 17     Temp 98.9 F (37.2 C)     Temp Source Oral     SpO2 98 %     Weight      Height      Head Circumference      Peak Flow      Pain Score 6     Pain Loc      Pain Education      Exclude from Growth Chart    No data found.  Updated Vital Signs BP 115/78 (BP Location: Left Arm)   Pulse (!) 105   Temp 98.9 F (37.2 C) (Oral)   Resp 17   LMP 10/21/2023 (Exact Date)   SpO2 98%   Visual Acuity Right Eye Distance:   Left Eye Distance:   Bilateral Distance:    Right Eye Near:   Left Eye Near:    Bilateral Near:     Physical Exam Vitals and nursing note reviewed.  Constitutional:      General: She is not in acute distress.    Appearance: She is well-developed. She is not ill-appearing.  HENT:     Head: Normocephalic and atraumatic.     Right Ear: Tympanic membrane and ear canal normal.     Left Ear: Tympanic membrane and ear canal normal.     Nose: Congestion present.     Mouth/Throat:     Mouth: Mucous membranes are moist.     Pharynx: Oropharynx is clear. Uvula midline. No oropharyngeal exudate or posterior oropharyngeal erythema.     Tonsils: No tonsillar exudate or tonsillar abscesses.  Eyes:     Conjunctiva/sclera: Conjunctivae normal.     Pupils: Pupils are equal, round, and reactive to light.  Cardiovascular:     Rate and Rhythm: Regular rhythm. Tachycardia present.     Heart sounds: Normal heart sounds.     Comments: Mildly tachycardia 105 Pulmonary:     Effort: Pulmonary effort is normal. No respiratory distress.     Breath sounds: Normal breath sounds.  Musculoskeletal:     Cervical  back: Normal range of motion and neck supple.  Lymphadenopathy:     Cervical: No cervical adenopathy.  Skin:    General: Skin is warm and dry.  Neurological:     General: No focal deficit  present.     Mental Status: She is alert and oriented to person, place, and time.  Psychiatric:        Mood and Affect: Mood normal.        Behavior: Behavior normal.      UC Treatments / Results  Labs (all labs ordered are listed, but only abnormal results are displayed) Labs Reviewed - No data to display  EKG   Radiology No results found.  Procedures Procedures (including critical care time)  Medications Ordered in UC Medications  ipratropium-albuterol (DUONEB) 0.5-2.5 (3) MG/3ML nebulizer solution 3 mL (3 mLs Nebulization Given 11/11/23 1532)    Initial Impression / Assessment and Plan / UC Course  I have reviewed the triage vital signs and the nursing notes.  Pertinent labs & imaging results that were available during my care of the patient were reviewed by me and considered in my medical decision making (see chart for details).     Reviewed exam and symptoms with patient.  No red flags.  Patient reports improvement in chest tightness and breathing after nebulizer.  Wet read of chest x-ray without obvious consolidation, will contact for any positive results based on radiology overread.  Discussed viral illness with asthma exacerbation.  Will start prednisone daily for 5 days.  Trial of Tessalon as needed for cough.  She is to continue albuterol inhaler as needed.  Follow-up with PCP if symptoms do not improve.  ER precautions reviewed. Final Clinical Impressions(s) / UC Diagnoses   Final diagnoses:  Acute cough  Shortness of breath  Mild intermittent asthma with acute exacerbation  Viral illness     Discharge Instructions      Continue inhaler as needed.  Start prednisone daily for 5 days.  You may try Tessalon 3 times a day as needed for cough.  Lots of rest and fluids.   Please follow-up with your PCP if your symptoms do not improve.  Please go to the ER for any worsening symptoms.  I hope you feel better soon!     ED Prescriptions     Medication Sig Dispense Auth. Provider   predniSONE (DELTASONE) 20 MG tablet Take 2 tablets (40 mg total) by mouth daily with breakfast for 5 days. 10 tablet Radford Pax, NP   benzonatate (TESSALON) 200 MG capsule Take 1 capsule (200 mg total) by mouth 3 (three) times daily as needed. 20 capsule Radford Pax, NP      PDMP not reviewed this encounter.   Radford Pax, NP 11/11/23 [phone]

## 2023-11-12 LAB — CULTURE, GROUP A STREP (THRC)

## 2024-07-05 ENCOUNTER — Other Ambulatory Visit: Payer: Self-pay

## 2024-07-05 ENCOUNTER — Ambulatory Visit
Admission: EM | Admit: 2024-07-05 | Discharge: 2024-07-05 | Disposition: A | Discharge status: Home / Self Care | Attending: Emergency Medicine | Admitting: Emergency Medicine

## 2024-07-05 ENCOUNTER — Encounter: Payer: Self-pay | Admitting: *Deleted

## 2024-07-05 DIAGNOSIS — K529 Noninfective gastroenteritis and colitis, unspecified: Secondary | ICD-10-CM | POA: Diagnosis not present

## 2024-07-05 MED ORDER — ONDANSETRON 4 MG PO TBDP: 4.0000 mg | ORAL_TABLET | Freq: Four times a day (QID) | ORAL | 0 refills | Status: AC | PRN

## 2024-07-05 MED ORDER — ONDANSETRON 4 MG PO TBDP
4.0000 mg | ORAL_TABLET | Freq: Once | ORAL | Status: AC
  Administered 2024-07-05: 4 mg via ORAL

## 2024-07-05 NOTE — ED Notes (Signed)
Pt tolerating water after zofran.

## 2024-07-05 NOTE — Discharge Instructions (Addendum)
 The zofran  can be used every 6 hours as needed to settle the stomach Tylenol  every 4-6 hours for abdominal pain. I recommend to avoid ibuprofen. Increase water and fluids as much as tolerated Bland diet only if you have appetite. Toast, crackers, rice, etc

## 2024-07-05 NOTE — ED Provider Notes (Signed)
 [facility]    CSN: [id number] Arrival date & time: 07/05/24  0802      History   Chief Complaint Chief Complaint  Patient presents with   Abdominal Pain   Emesis    HPI Julia Gaines is a 28 y.o. female.  Around 1 this morning woke with cramping abdominal pain, rating 6/10 She had 3 episodes of emesis and some liquid diarrhea No fever  Last night she ate at [employer]  Tried tylenol  and pepto bismol but threw them up  LMP 7/14  Denies urinary symptoms   History reviewed. No pertinent past medical history.  There are no active problems to display for this patient.   Past Surgical History:  Procedure Laterality Date   BACK SURGERY     BREAST SURGERY     fibroid removed   DG SCOLIOSIS SERIES ([facility] HX)      OB History   No obstetric history on file.      Home Medications    Prior to Admission medications   Medication Sig Start Date End Date Taking? Authorizing Provider  albuterol  (VENTOLIN  HFA) 108 (90 Base) MCG/ACT inhaler Inhale 1-2 puffs into the lungs every 6 (six) hours as needed for wheezing or shortness of breath. 11/09/23  Yes Christopher Savannah, PA-C  cetirizine  (ZYRTEC  ALLERGY) 10 MG tablet Take 1 tablet (10 mg total) by mouth daily. 11/09/23  Yes Christopher Savannah, PA-C  cholecalciferol (VITAMIN D3) 25 MCG (1000 UNIT) tablet Take 1,000 Units by mouth daily.   Yes [provider]  fluticasone (FLONASE) 50 MCG/ACT nasal spray Place 1 spray into both nostrils daily as needed for allergies or rhinitis.   Yes [provider]  montelukast (SINGULAIR) 10 MG tablet Take 10 mg by mouth at bedtime.   Yes [provider]  Multiple Vitamin (MULTIVITAMIN WITH MINERALS) TABS tablet Take 1 tablet by mouth daily.   Yes [provider]  norethindrone-ethinyl estradiol (LOESTRIN FE) 1-20 MG-MCG tablet Take 1 tablet by mouth daily. 08/07/19  Yes [provider]    Family History Family History  Problem Relation Age of  Onset   Hypertension Father     Social History Social History   Tobacco Use   Smoking status: Never   Smokeless tobacco: Never  Vaping Use   Vaping status: Never Used  Substance Use Topics   Alcohol use: Yes    Comment: occasionally   Drug use: No     Allergies   Penicillins   Review of Systems Review of Systems As per HPI  Physical Exam Triage Vital Signs ED Triage Vitals [07/05/24 0828]  Encounter Vitals Group     BP      Girls Systolic BP Percentile      Girls Diastolic BP Percentile      Boys Systolic BP Percentile      Boys Diastolic BP Percentile      Pulse      Resp      Temp      Temp src      SpO2      Weight      Height      Head Circumference      Peak Flow      Pain Score 6     Pain Loc      Pain Education      Exclude from Growth Chart    No data found.  Updated Vital Signs BP 122/80 (BP Location: Left Arm)   Pulse 93  Temp 98.1 F (36.7 C) (Oral)   Resp 16   LMP 06/14/2024   SpO2 98%   Visual Acuity Right Eye Distance:   Left Eye Distance:   Bilateral Distance:    Right Eye Near:   Left Eye Near:    Bilateral Near:     Physical Exam Vitals and nursing note reviewed.  Constitutional:      Appearance: Normal appearance.  HENT:     Mouth/Throat:     Mouth: Mucous membranes are moist.     Pharynx: Oropharynx is clear.  Eyes:     Conjunctiva/sclera: Conjunctivae normal.  Cardiovascular:     Rate and Rhythm: Normal rate and regular rhythm.     Heart sounds: Normal heart sounds.  Pulmonary:     Effort: Pulmonary effort is normal. No respiratory distress.     Breath sounds: Normal breath sounds.  Abdominal:     General: Bowel sounds are normal.     Palpations: Abdomen is soft.     Tenderness: There is generalized abdominal tenderness. There is no right CVA tenderness, left CVA tenderness, guarding or rebound.     Comments: Generalized discomfort. No point tenderness, guarding, rebound   Musculoskeletal:        General:  Normal range of motion.  Skin:    General: Skin is warm and dry.  Neurological:     Mental Status: She is alert and oriented to person, place, and time.     UC Treatments / Results  Labs (all labs ordered are listed, but only abnormal results are displayed) Labs Reviewed - No data to display  EKG  Radiology No results found.  Procedures Procedures   Medications Ordered in UC Medications  ondansetron  (ZOFRAN -ODT) disintegrating tablet 4 mg (4 mg Oral Given 07/05/24 0843)    Initial Impression / Assessment and Plan / UC Course  I have reviewed the triage vital signs and the nursing notes.  Pertinent labs & imaging results that were available during my care of the patient were reviewed by me and considered in my medical decision making (see chart for details).  Afebrile Zofran  ODT given in clinic PO challenge successful.  With symptoms starting about 4-5 hours after eating out, likely food-borne illness. Continue zofran  q6 hours prn, increase fluids, tylenol  prn Note for work is provided   Final Clinical Impressions(s) / UC Diagnoses   Final diagnoses:  Gastroenteritis     Discharge Instructions      The zofran  can be used every 6 hours as needed to settle the stomach Tylenol  every 4-6 hours for abdominal pain. I recommend to avoid ibuprofen. Increase water and fluids as much as tolerated Bland diet only if you have appetite. Toast, crackers, rice, etc     ED Prescriptions   None    PDMP not reviewed this encounter.   Ragina Fenter, [place], [place] 07/05/24 [phone]

## 2024-07-05 NOTE — ED Triage Notes (Signed)
 Pt reports crampy abdominal pain since 0230. Reports emesis x 3, diarrhea x 2. She took tylenol  and pepto for the pain but then started vomiting

## 2024-09-04 ENCOUNTER — Other Ambulatory Visit: Payer: Self-pay

## 2024-09-04 ENCOUNTER — Ambulatory Visit
Admission: RE | Admit: 2024-09-04 | Discharge: 2024-09-04 | Disposition: A | Discharge status: Home / Self Care | Payer: Self-pay | Source: Ambulatory Visit | Attending: Nurse Practitioner | Admitting: Nurse Practitioner

## 2024-09-04 VITALS — BP 125/80 | HR 97 | Temp 98.6°F | Resp 16

## 2024-09-04 DIAGNOSIS — R350 Frequency of micturition: Secondary | ICD-10-CM | POA: Insufficient documentation

## 2024-09-04 LAB — POCT URINE DIPSTICK
Blood, UA: NEGATIVE
Glucose, UA: NEGATIVE mg/dL
Leukocytes, UA: NEGATIVE
Nitrite, UA: NEGATIVE
POC PROTEIN,UA: 30 — AB
Spec Grav, UA: >=1.03 — AB (ref 1.010–1.025)
Urobilinogen, UA: 2 U/dL — AB
pH, UA: 6 (ref 5.0–8.0)

## 2024-09-04 LAB — POCT URINE PREGNANCY: Preg Test, Ur: NEGATIVE

## 2024-09-04 NOTE — ED Triage Notes (Addendum)
 I have the urge to use restroom like a UTI, no burning, but more urge and a little pain - Entered by patient  Pt reports she feels like she is getting a UTI. She has had urgency for the last 2 days. Denies vaginal discharge. No meds tried.

## 2024-09-04 NOTE — ED Provider Notes (Signed)
 [facility]    CSN: [id number] Arrival date & time: 09/04/24  1004      History   Chief Complaint Chief Complaint  Patient presents with   Urinary Frequency    HPI Julia Gaines is a 28 y.o. female.   Discussed the use of AI scribe software for clinical note transcription with the patient, who gave verbal consent to proceed.   Patient presents with increased urinary frequency for 2 days. She also reports having increased urge to urinate. She denies any burning or pain with urination. She denies any vaginal discharge, odor, itching, or irritation. Her last menstrual period was around September 13th, and she is currently on birth control.  The following sections of the patient's history were reviewed and updated as appropriate: allergies, current medications, past family history, past medical history, past social history, past surgical history, and problem list.      History reviewed. No pertinent past medical history.  There are no active problems to display for this patient.   Past Surgical History:  Procedure Laterality Date   BACK SURGERY     BREAST SURGERY     fibroid removed   DG SCOLIOSIS SERIES ([facility] HX)      OB History   No obstetric history on file.      Home Medications    Prior to Admission medications   Medication Sig Start Date End Date Taking? Authorizing Provider  albuterol  (VENTOLIN  HFA) 108 (90 Base) MCG/ACT inhaler Inhale 1-2 puffs into the lungs every 6 (six) hours as needed for wheezing or shortness of breath. 11/09/23  Yes Christopher Savannah, PA-C  cetirizine  (ZYRTEC  ALLERGY) 10 MG tablet Take 1 tablet (10 mg total) by mouth daily. 11/09/23  Yes Christopher Savannah, PA-C  cholecalciferol (VITAMIN D3) 25 MCG (1000 UNIT) tablet Take 1,000 Units by mouth daily.   Yes [provider]  fluticasone (FLONASE) 50 MCG/ACT nasal spray Place 1 spray into both nostrils daily as needed for allergies or rhinitis.   Yes [provider]   montelukast (SINGULAIR) 10 MG tablet Take 10 mg by mouth at bedtime.   Yes [provider]  Multiple Vitamin (MULTIVITAMIN WITH MINERALS) TABS tablet Take 1 tablet by mouth daily.   Yes [provider]  norethindrone-ethinyl estradiol (LOESTRIN FE) 1-20 MG-MCG tablet Take 1 tablet by mouth daily. 08/07/19  Yes [provider]  ondansetron  (ZOFRAN -ODT) 4 MG disintegrating tablet Take 1 tablet (4 mg total) by mouth every 6 (six) hours as needed for nausea or vomiting. Patient not taking: Reported on 09/04/2024 07/05/24   Rising, [place], PA-C    Family History Family History  Problem Relation Age of Onset   Hypertension Father     Social History Social History   Tobacco Use   Smoking status: Never   Smokeless tobacco: Never  Vaping Use   Vaping status: Never Used  Substance Use Topics   Alcohol use: Yes    Comment: occasionally   Drug use: No     Allergies   Penicillins   Review of Systems Review of Systems  Genitourinary:  Positive for frequency and urgency. Negative for dysuria, menstrual problem (LMP around 08/14/24) and vaginal discharge.       No vaginal irritation, itching or odor   All other systems reviewed and are negative.    Physical Exam Triage Vital Signs ED Triage Vitals  Encounter Vitals Group     BP 09/04/24 1030 125/80     Girls Systolic BP Percentile --  Girls Diastolic BP Percentile --      Boys Systolic BP Percentile --      Boys Diastolic BP Percentile --      Pulse Rate 09/04/24 1030 97     Resp 09/04/24 1030 16     Temp 09/04/24 1030 98.6 F (37 C)     Temp Source 09/04/24 1030 Oral     SpO2 09/04/24 1030 95 %     Weight --      Height --      Head Circumference --      Peak Flow --      Pain Score 09/04/24 1023 0     Pain Loc --      Pain Education --      Exclude from Growth Chart --    No data found.  Updated Vital Signs BP 125/80 (BP Location: Right Arm)   Pulse 97   Temp 98.6 F (37 C) (Oral)    Resp 16   LMP 08/14/2024 (Approximate)   SpO2 95%   Visual Acuity Right Eye Distance:   Left Eye Distance:   Bilateral Distance:    Right Eye Near:   Left Eye Near:    Bilateral Near:     Physical Exam Vitals reviewed.  Constitutional:      General: She is awake. She is not in acute distress.    Appearance: Normal appearance. She is well-developed. She is not ill-appearing, toxic-appearing or diaphoretic.  HENT:     Head: Normocephalic.     Right Ear: Hearing normal.     Left Ear: Hearing normal.     Nose: Nose normal.     Mouth/Throat:     Mouth: Mucous membranes are moist.  Eyes:     General: Vision grossly intact.     Conjunctiva/sclera: Conjunctivae normal.  Cardiovascular:     Rate and Rhythm: Normal rate and regular rhythm.     Heart sounds: Normal heart sounds.  Pulmonary:     Effort: Pulmonary effort is normal.     Breath sounds: Normal breath sounds and air entry.  Musculoskeletal:        General: Normal range of motion.     Cervical back: Normal range of motion and neck supple.  Skin:    General: Skin is warm and dry.  Neurological:     General: No focal deficit present.     Mental Status: She is alert and oriented to person, place, and time.  Psychiatric:        Speech: Speech normal.        Behavior: Behavior is cooperative.      UC Treatments / Results  Labs (all labs ordered are listed, but only abnormal results are displayed) Labs Reviewed  POCT URINE DIPSTICK - Abnormal; Notable for the following components:      Result Value   Clarity, UA cloudy (*)    Bilirubin, UA small (*)    Ketones, POC UA trace (5) (*)    Spec Grav, UA >=1.030 (*)    POC PROTEIN,UA =30 (*)    Urobilinogen, UA 2.0 (*)    All other components within normal limits  POCT URINE PREGNANCY - Normal  URINE CULTURE    EKG   Radiology No results found.  Procedures Procedures (including critical care time)  Medications Ordered in UC Medications - No data to  display  Initial Impression / Assessment and Plan / UC Course  I have reviewed the triage vital signs and the  nursing notes.  Pertinent labs & imaging results that were available during my care of the patient were reviewed by me and considered in my medical decision making (see chart for details).     Patient presents with symptoms concerning for an urinary tract infection. Urinalysis negative for microscopic hematuria, nitrites, and leukocyte esterase. Nitrofurantoin was prescribed to be taken twice daily for 5 days. A urine culture was sent to identify a possible causative organism and assess antibiotic sensitivity. Patient was advised that they will be contacted only if the culture results require a change in treatment; otherwise, results can be reviewed via MyChart. Patient instructed to increase fluid intake and monitor symptoms. Follow up with primary care provider if symptoms do not improve or worsen. Emergency evaluation is warranted for fever, back or flank pain, nausea, vomiting, or signs of systemic illness.  Today's evaluation has revealed no signs of a dangerous process. Discussed diagnosis with patient and/or guardian. Patient and/or guardian aware of their diagnosis, possible red flag symptoms to watch out for and need for close follow up. Patient and/or guardian understands verbal and written discharge instructions. Patient and/or guardian comfortable with plan and disposition.  Patient and/or guardian has a clear mental status at this time, good insight into illness (after discussion and teaching) and has clear judgment to make decisions regarding their care  Documentation was completed with the aid of voice recognition software. Transcription may contain typographical errors.   Final Clinical Impressions(s) / UC Diagnoses   Final diagnoses:  Urine frequency     Discharge Instructions      You were seen today for symptoms that may be related to a urinary tract infection.  Your urine test today did not show signs typically seen with infection, but a urine culture has been sent to look more closely for bacteria and to check which antibiotics would work best if needed. You have been prescribed nitrofurantoin to take twice a day for five days. It is important to take the medication exactly as directed, even if you start to feel better before finishing the course. You will only be contacted if your culture results show that a different antibiotic is needed, and you can also review the results through MyChart. In addition to taking your medication, you should increase your fluid intake by drinking water regularly throughout the day. This helps flush bacteria from the urinary tract and can ease discomfort. Please follow up with your primary care provider if your symptoms do not improve after completing the antibiotics or if they get worse during treatment. Go to the emergency department right away if you develop fever, chills, nausea, vomiting, pain in your back or side near the kidneys, or if you feel very unwell, as these can be signs of a more serious infection.     ED Prescriptions   None    PDMP not reviewed this encounter.   Iola Lukes, [place] 09/04/24 1057

## 2024-09-04 NOTE — Discharge Instructions (Addendum)
 You were seen today for symptoms that may be related to a urinary tract infection. Your urine test today did not show signs typically seen with infection, but a urine culture has been sent to look more closely for bacteria and to check which antibiotics would work best if needed. You have been prescribed nitrofurantoin to take twice a day for five days. It is important to take the medication exactly as directed, even if you start to feel better before finishing the course. You will only be contacted if your culture results show that a different antibiotic is needed, and you can also review the results through MyChart. In addition to taking your medication, you should increase your fluid intake by drinking water regularly throughout the day. This helps flush bacteria from the urinary tract and can ease discomfort. Please follow up with your primary care provider if your symptoms do not improve after completing the antibiotics or if they get worse during treatment. Go to the emergency department right away if you develop fever, chills, nausea, vomiting, pain in your back or side near the kidneys, or if you feel very unwell, as these can be signs of a more serious infection.

## 2024-09-06 ENCOUNTER — Telehealth: Payer: Self-pay | Admitting: Emergency Medicine

## 2024-09-06 ENCOUNTER — Ambulatory Visit: Payer: Self-pay | Admitting: Nurse Practitioner

## 2024-09-06 LAB — URINE CULTURE: Culture: 20000 — AB

## 2024-09-06 MED ORDER — CIPROFLOXACIN HCL 500 MG PO TABS: 500.0000 mg | ORAL_TABLET | Freq: Two times a day (BID) | ORAL | 0 refills | Status: AC

## 2024-09-06 MED ORDER — CIPROFLOXACIN HCL 500 MG PO TABS: 500.0000 mg | ORAL_TABLET | Freq: Two times a day (BID) | ORAL | 0 refills | Status: DC

## 2024-09-06 NOTE — Telephone Encounter (Signed)
 Pt called in to request pharmacy change for cipro.  Called and cancelled Rx at [facility].
